# Patient Record
Sex: Female | Born: 2000 | Race: Black or African American | Hispanic: No | Marital: Single
Health system: Southern US, Community
[De-identification: ages and names within clinical notes are randomized; demographics above are authoritative.]

---
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
## 2019-11-07 ENCOUNTER — Emergency Department (HOSPITAL_COMMUNITY)
Admission: EM | Admit: 2019-11-07 | Discharge: 2019-11-08 | Disposition: A | Discharge status: Home / Self Care | Payer: No Typology Code available for payment source | Attending: Emergency Medicine | Admitting: Emergency Medicine

## 2019-11-07 ENCOUNTER — Encounter (HOSPITAL_COMMUNITY): Payer: Self-pay | Admitting: *Deleted

## 2019-11-07 DIAGNOSIS — Z20822 Contact with and (suspected) exposure to covid-19: Secondary | ICD-10-CM | POA: Insufficient documentation

## 2019-11-07 DIAGNOSIS — R112 Nausea with vomiting, unspecified: Secondary | ICD-10-CM | POA: Diagnosis present

## 2019-11-07 DIAGNOSIS — R1114 Bilious vomiting: Secondary | ICD-10-CM

## 2019-11-07 DIAGNOSIS — R519 Headache, unspecified: Secondary | ICD-10-CM | POA: Insufficient documentation

## 2019-11-07 DIAGNOSIS — R109 Unspecified abdominal pain: Secondary | ICD-10-CM | POA: Insufficient documentation

## 2019-11-07 DIAGNOSIS — R000 Tachycardia, unspecified: Secondary | ICD-10-CM | POA: Insufficient documentation

## 2019-11-07 NOTE — ED Triage Notes (Signed)
Vomiting and headache, leg pain and back pain. Recent negative covid.

## 2019-11-08 LAB — URINALYSIS, ROUTINE W REFLEX MICROSCOPIC
Bacteria, UA: NONE SEEN
Bilirubin Urine: NEGATIVE
Glucose, UA: NEGATIVE mg/dL
Hgb urine dipstick: NEGATIVE
Ketones, ur: 20 mg/dL — AB
Leukocytes,Ua: NEGATIVE
Nitrite: NEGATIVE
Protein, ur: 30 mg/dL — AB
Specific Gravity, Urine: 1.028 (ref 1.005–1.030)
pH: 7 (ref 5.0–8.0)

## 2019-11-08 LAB — CBC
HCT: 37.7 % (ref 36.0–46.0)
Hemoglobin: 12.6 g/dL (ref 12.0–15.0)
MCH: 29.7 pg (ref 26.0–34.0)
MCHC: 33.4 g/dL (ref 30.0–36.0)
MCV: 88.9 fL (ref 80.0–100.0)
Platelets: 254 10*3/uL (ref 150–400)
RBC: 4.24 MIL/uL (ref 3.87–5.11)
RDW: 12 % (ref 11.5–15.5)
WBC: 14.2 10*3/uL — ABNORMAL HIGH (ref 4.0–10.5)
nRBC: 0 % (ref 0.0–0.2)

## 2019-11-08 LAB — COMPREHENSIVE METABOLIC PANEL
ALT: 16 U/L (ref 0–44)
AST: 20 U/L (ref 15–41)
Albumin: 4.2 g/dL (ref 3.5–5.0)
Alkaline Phosphatase: 53 U/L (ref 38–126)
Anion gap: 12 (ref 5–15)
BUN: 7 mg/dL (ref 6–20)
CO2: 21 mmol/L — ABNORMAL LOW (ref 22–32)
Calcium: 9.3 mg/dL (ref 8.9–10.3)
Chloride: 102 mmol/L (ref 98–111)
Creatinine, Ser: 0.81 mg/dL (ref 0.44–1.00)
GFR calc Af Amer: >60 mL/min (ref >60)
GFR calc non Af Amer: >60 mL/min (ref >60)
Glucose, Bld: 113 mg/dL — ABNORMAL HIGH (ref 70–99)
Potassium: 3.7 mmol/L (ref 3.5–5.1)
Sodium: 135 mmol/L (ref 135–145)
Total Bilirubin: 0.9 mg/dL (ref 0.3–1.2)
Total Protein: 7.7 g/dL (ref 6.5–8.1)

## 2019-11-08 LAB — RESP PANEL BY RT PCR (RSV, FLU A&B, COVID)
Influenza A by PCR: NEGATIVE
Influenza B by PCR: NEGATIVE
Respiratory Syncytial Virus by PCR: NEGATIVE
SARS Coronavirus 2 by RT PCR: NEGATIVE

## 2019-11-08 LAB — LIPASE, BLOOD: Lipase: 26 U/L (ref 11–51)

## 2019-11-08 LAB — I-STAT BETA HCG BLOOD, ED (MC, WL, AP ONLY): I-stat hCG, quantitative: <5 m[IU]/mL (ref <5)

## 2019-11-08 MED ORDER — ONDANSETRON HCL 4 MG/2ML IJ SOLN: 4.0000 mg | Freq: Once | INTRAMUSCULAR | Status: DC

## 2019-11-08 MED ORDER — ONDANSETRON 4 MG PO TBDP: 4.0000 mg | ORAL_TABLET | Freq: Three times a day (TID) | ORAL | 0 refills | Status: AC | PRN

## 2019-11-08 MED ORDER — PROCHLORPERAZINE EDISYLATE 10 MG/2ML IJ SOLN
5.0000 mg | Freq: Once | INTRAMUSCULAR | Status: AC
  Administered 2019-11-08: 5 mg via INTRAVENOUS
  Filled 2019-11-08: qty 2

## 2019-11-08 MED ORDER — SODIUM CHLORIDE 0.9 % IV BOLUS
1000.0000 mL | Freq: Once | INTRAVENOUS | Status: AC
  Administered 2019-11-08: 1000 mL via INTRAVENOUS

## 2019-11-08 NOTE — ED Provider Notes (Signed)
[facility] EMERGENCY DEPARTMENT Provider Note   CSN: [id number] Arrival date & time: 11/07/19  2329     History Chief Complaint  Patient presents with  . Emesis    Brenda Page is a 19 y.o. female.  HPI    Patient presents with concern of 1 day of nausea, vomiting, headache, generalized discomfort. She did have abdominal pain, but has none currently. The pain is focally around the mid upper abdomen.  Pain is sore, nonradiating. She is here with a female companion.  Line they hypothesized about possible food reaction, although he has had no illness after eating the same meal 3 days ago. Symptoms began yesterday, and have been persistent, with multiple episodes of vomiting, to the point of bilious material being produced. No fever, no diarrhea.  She has developed a headache as well, diffuse. She has been unable to tolerate any medication for relief.  No past medical history, no past surgical history  Patient does not smoke, drink   Home Medications Prior to Admission medications   Not on File    Allergies    Patient has no allergy information on record.  Review of Systems   Review of Systems  Constitutional:       Per HPI, otherwise negative  HENT:       Per HPI, otherwise negative  Respiratory:       Per HPI, otherwise negative  Cardiovascular:       Per HPI, otherwise negative  Gastrointestinal: Positive for abdominal pain, nausea and vomiting.  Endocrine:       Negative aside from HPI  Genitourinary:       Neg aside from HPI   Musculoskeletal:       Per HPI, otherwise negative  Skin: Negative.   Neurological: Positive for headaches. Negative for syncope.    Physical Exam Updated Vital Signs BP 112/79   Pulse 89   Temp 98.6 F (37 C)   Resp 18   LMP 10/18/2019   SpO2 99%   Physical Exam Vitals and nursing note reviewed.  Constitutional:      General: She is not in acute distress.    Appearance: She is well-developed.  HENT:      Head: Normocephalic and atraumatic.  Eyes:     Conjunctiva/sclera: Conjunctivae normal.  Cardiovascular:     Rate and Rhythm: Regular rhythm. Tachycardia present.  Pulmonary:     Effort: Pulmonary effort is normal. No respiratory distress.     Breath sounds: Normal breath sounds. No stridor.  Abdominal:     General: There is no distension.     Tenderness: There is no abdominal tenderness. There is no guarding.  Skin:    General: Skin is warm and dry.  Neurological:     Mental Status: She is alert and oriented to person, place, and time.     Cranial Nerves: No cranial nerve deficit.     ED Results / Procedures / Treatments   Labs (all labs ordered are listed, but only abnormal results are displayed) Labs Reviewed  COMPREHENSIVE METABOLIC PANEL - Abnormal; Notable for the following components:      Result Value   CO2 21 (*)    Glucose, Bld 113 (*)    All other components within normal limits  CBC - Abnormal; Notable for the following components:   WBC 14.2 (*)    All other components within normal limits  URINALYSIS, ROUTINE W REFLEX MICROSCOPIC - Abnormal; Notable for the following components:  Ketones, ur 20 (*)    Protein, ur 30 (*)    All other components within normal limits  RESP PANEL BY RT PCR (RSV, FLU A&B, COVID)  LIPASE, BLOOD  I-STAT BETA HCG BLOOD, ED ([facility], [facility], [facility] ONLY)    Procedures Procedures (including critical care time)  Medications Ordered in ED Medications  sodium chloride 0.9 % bolus 1,000 mL (1,000 mLs Intravenous New Bag/Given 11/08/19 0909)  prochlorperazine (COMPAZINE) injection 5 mg (5 mg Intravenous Given 11/08/19 0900)    ED Course  I have reviewed the triage vital signs and the nursing notes.  Pertinent labs & imaging results that were available during my care of the patient were reviewed by me and considered in my medical decision making (see chart for details).    10:52 AM Patient in no distress, states that she feels better. She  has received fluid resuscitation, Compazine, has no ongoing complaints per We discussed today's presentation again, with her female companion present. Presentation consistent with GI illness, possibly food related. With mild ketonuria, headache, patient's resuscitation seemingly was successful.  Well with no ongoing abdominal pain, fever, no negation for imaging, though we discussed possibilities including biliary colic. Patient appropriate for, amenable to discharge with outpatient follow-up as needed.  MDM Rules/Calculators/A&P    MDM Number of Diagnoses or Management Options Bad headache: new, needed workup Bilious vomiting with nausea: new, needed workup   Amount and/or Complexity of Data Reviewed Clinical lab tests: reviewed Tests in the medicine section of CPT: reviewed Obtain history from someone other than the patient: yes  Risk of Complications, Morbidity, and/or Mortality Presenting problems: high Diagnostic procedures: high Management options: high  Critical Care Total time providing critical care: < 30 minutes  Patient Progress Patient progress: improved  Final Clinical Impression(s) / ED Diagnoses Final diagnoses:  Bilious vomiting with nausea  Bad headache    Rx / DC Orders ED Discharge Orders         Ordered    ondansetron (ZOFRAN ODT) 4 MG disintegrating tablet  Every 8 hours PRN        11/08/19 1054           Gerhard Munch, MD 11/08/19 1056

## 2019-11-08 NOTE — Discharge Instructions (Addendum)
As discussed, your evaluation today has been largely reassuring.  But, it is important that you monitor your condition carefully, and do not hesitate to return to the ED if you develop new, or concerning changes in your condition. ? ?Otherwise, please follow-up with your physician for appropriate ongoing care. ? ?
